# Patient Record
Sex: Male | Born: 1935 | Race: White | Hispanic: No | Marital: Married | Smoking: Never smoker
Health system: Southern US, Community
[De-identification: ages and names within clinical notes are randomized; demographics above are authoritative.]

## PROBLEM LIST (undated history)

## (undated) DIAGNOSIS — G309 Alzheimer's disease, unspecified: Principal | ICD-10-CM

## (undated) DIAGNOSIS — G610 Guillain-Barre syndrome: Secondary | ICD-10-CM

## (undated) DIAGNOSIS — E119 Type 2 diabetes mellitus without complications: Secondary | ICD-10-CM

## (undated) DIAGNOSIS — F0280 Dementia in other diseases classified elsewhere without behavioral disturbance: Principal | ICD-10-CM

## (undated) DIAGNOSIS — F0150 Vascular dementia without behavioral disturbance: Secondary | ICD-10-CM

## (undated) DIAGNOSIS — M47812 Spondylosis without myelopathy or radiculopathy, cervical region: Secondary | ICD-10-CM

## (undated) DIAGNOSIS — I119 Hypertensive heart disease without heart failure: Secondary | ICD-10-CM

## (undated) DIAGNOSIS — H409 Unspecified glaucoma: Secondary | ICD-10-CM

## (undated) DIAGNOSIS — I10 Essential (primary) hypertension: Secondary | ICD-10-CM

## (undated) DIAGNOSIS — Z8546 Personal history of malignant neoplasm of prostate: Secondary | ICD-10-CM

## (undated) HISTORY — DX: Spondylosis without myelopathy or radiculopathy, cervical region: M47.812

## (undated) HISTORY — DX: Vascular dementia, unspecified severity, without behavioral disturbance, psychotic disturbance, mood disturbance, and anxiety: F01.50

## (undated) HISTORY — DX: Unspecified glaucoma: H40.9

## (undated) HISTORY — DX: Essential (primary) hypertension: I10

## (undated) HISTORY — DX: Personal history of malignant neoplasm of prostate: Z85.46

## (undated) HISTORY — PX: INGUINAL HERNIA REPAIR: SUR1180

## (undated) HISTORY — DX: Hypertensive heart disease without heart failure: I11.9

## (undated) HISTORY — DX: Alzheimer's disease, unspecified: G30.9

## (undated) HISTORY — DX: Type 2 diabetes mellitus without complications: E11.9

## (undated) HISTORY — DX: Dementia in other diseases classified elsewhere without behavioral disturbance: F02.80

## (undated) HISTORY — DX: Guillain-Barre syndrome: G61.0

---

## 1958-04-22 HISTORY — PX: CHOLECYSTECTOMY: SHX55

## 1989-04-22 DIAGNOSIS — Z8546 Personal history of malignant neoplasm of prostate: Secondary | ICD-10-CM

## 1989-04-22 DIAGNOSIS — G610 Guillain-Barre syndrome: Secondary | ICD-10-CM

## 1989-04-22 HISTORY — DX: Personal history of malignant neoplasm of prostate: Z85.46

## 1989-04-22 HISTORY — DX: Guillain-Barre syndrome: G61.0

## 1997-10-17 ENCOUNTER — Ambulatory Visit: Admission: RE | Admit: 1997-10-17 | Discharge: 1997-10-17 | Payer: Self-pay | Admitting: Radiation Oncology

## 1999-04-26 ENCOUNTER — Encounter: Admission: RE | Admit: 1999-04-26 | Discharge: 1999-04-26 | Payer: Self-pay | Admitting: Neurosurgery

## 1999-04-26 ENCOUNTER — Encounter: Payer: Self-pay | Admitting: Neurosurgery

## 1999-11-26 ENCOUNTER — Encounter: Admission: RE | Admit: 1999-11-26 | Discharge: 2000-02-24 | Payer: Self-pay | Admitting: Radiation Oncology

## 2000-12-02 ENCOUNTER — Encounter: Admission: RE | Admit: 2000-12-02 | Discharge: 2001-03-02 | Payer: Self-pay | Admitting: Radiation Oncology

## 2002-02-08 ENCOUNTER — Ambulatory Visit (HOSPITAL_COMMUNITY): Admission: RE | Admit: 2002-02-08 | Discharge: 2002-02-08 | Payer: Self-pay | Admitting: Radiation Oncology

## 2003-02-14 ENCOUNTER — Ambulatory Visit: Admission: RE | Admit: 2003-02-14 | Discharge: 2003-02-14 | Payer: Self-pay | Admitting: Radiation Oncology

## 2003-02-28 ENCOUNTER — Ambulatory Visit: Admission: RE | Admit: 2003-02-28 | Discharge: 2003-02-28 | Payer: Self-pay | Admitting: Radiation Oncology

## 2004-02-21 ENCOUNTER — Ambulatory Visit: Payer: Self-pay | Admitting: Gastroenterology

## 2004-03-05 ENCOUNTER — Ambulatory Visit: Admission: RE | Admit: 2004-03-05 | Discharge: 2004-03-05 | Payer: Self-pay | Admitting: Radiation Oncology

## 2004-03-12 ENCOUNTER — Ambulatory Visit: Admission: RE | Admit: 2004-03-12 | Discharge: 2004-03-12 | Payer: Self-pay | Admitting: Radiation Oncology

## 2004-03-27 ENCOUNTER — Ambulatory Visit: Admission: RE | Admit: 2004-03-27 | Discharge: 2004-03-27 | Payer: Self-pay | Admitting: Radiation Oncology

## 2005-06-10 ENCOUNTER — Emergency Department: Payer: Self-pay | Admitting: Emergency Medicine

## 2005-06-10 ENCOUNTER — Other Ambulatory Visit: Payer: Self-pay

## 2006-10-22 ENCOUNTER — Ambulatory Visit: Payer: Self-pay | Admitting: Unknown Physician Specialty

## 2010-06-01 ENCOUNTER — Inpatient Hospital Stay: Payer: Self-pay | Admitting: Internal Medicine

## 2010-06-06 ENCOUNTER — Encounter: Payer: Self-pay | Admitting: Internal Medicine

## 2010-06-21 ENCOUNTER — Encounter: Payer: Self-pay | Admitting: Internal Medicine

## 2011-06-24 ENCOUNTER — Emergency Department: Payer: Self-pay | Admitting: Emergency Medicine

## 2013-06-22 ENCOUNTER — Emergency Department: Payer: Self-pay | Admitting: Emergency Medicine

## 2013-06-22 LAB — COMPREHENSIVE METABOLIC PANEL
ALK PHOS: 105 U/L
ALT: 20 U/L (ref 12–78)
Albumin: 3.5 g/dL (ref 3.4–5.0)
Anion Gap: 6 — ABNORMAL LOW (ref 7–16)
BILIRUBIN TOTAL: 0.5 mg/dL (ref 0.2–1.0)
BUN: 20 mg/dL — ABNORMAL HIGH (ref 7–18)
Calcium, Total: 8.7 mg/dL (ref 8.5–10.1)
Chloride: 97 mmol/L — ABNORMAL LOW (ref 98–107)
Co2: 29 mmol/L (ref 21–32)
Creatinine: 1.08 mg/dL (ref 0.60–1.30)
EGFR (African American): >60
EGFR (Non-African Amer.): 59 — ABNORMAL LOW
Glucose: 180 mg/dL — ABNORMAL HIGH (ref 65–99)
Osmolality: 272 (ref 275–301)
POTASSIUM: 3.7 mmol/L (ref 3.5–5.1)
SGOT(AST): 21 U/L (ref 15–37)
SODIUM: 132 mmol/L — AB (ref 136–145)
Total Protein: 7.5 g/dL (ref 6.4–8.2)

## 2013-06-22 LAB — URINALYSIS, COMPLETE
BILIRUBIN, UR: NEGATIVE
Bacteria: NONE SEEN
Blood: NEGATIVE
Glucose,UR: 50 mg/dL (ref 0–75)
Hyaline Cast: 6
KETONE: NEGATIVE
LEUKOCYTE ESTERASE: NEGATIVE
Nitrite: NEGATIVE
PROTEIN: NEGATIVE
Ph: 6 (ref 4.5–8.0)
RBC,UR: 2 /HPF (ref 0–5)
Specific Gravity: 1.016 (ref 1.003–1.030)
Squamous Epithelial: <1
WBC UR: 1 /HPF (ref 0–5)

## 2013-06-22 LAB — CBC
HCT: 40.2 % (ref 40.0–52.0)
HGB: 13.7 g/dL (ref 13.0–18.0)
MCH: 31.5 pg (ref 26.0–34.0)
MCHC: 34.1 g/dL (ref 32.0–36.0)
MCV: 92 fL (ref 80–100)
PLATELETS: 245 10*3/uL (ref 150–440)
RBC: 4.35 10*6/uL — ABNORMAL LOW (ref 4.40–5.90)
RDW: 13.6 % (ref 11.5–14.5)
WBC: 10.7 10*3/uL — ABNORMAL HIGH (ref 3.8–10.6)

## 2013-06-22 LAB — TROPONIN I: Troponin-I: <0.02 ng/mL

## 2013-07-07 ENCOUNTER — Telehealth: Payer: Self-pay | Admitting: Internal Medicine

## 2013-07-07 NOTE — Telephone Encounter (Signed)
Find out if this is the same patient they called about earlier who lives out by [place] I was going to add him on my home visit list but never heard back from anyone

## 2013-07-07 NOTE — Telephone Encounter (Signed)
Molli KnockOkay You can add this Friday if they can make it

## 2013-07-07 NOTE — Telephone Encounter (Signed)
Patient is seeing Mimi McLaughlin,PA at [facility].  He did have a Doctor until 2 years ago, when his doctor retired.  Patient is being seen by [facility] after a hospital er visit.  Patient was unable to stand or walk.  ER recommended Physical Therapy and sent out [facility]. Mimi Merlinda FrederickMcLaughlin saw patient after his er visit and told him it could be shingles and gave them medication. The social worker with [facility] told them to call and try and get in with Dr.Letvak.  I scheduled patient for a new patient visit with Dr.Letvak on 11/23/13.  Patient's wife asked if you could see him sooner. Please advise.

## 2013-07-07 NOTE — Telephone Encounter (Signed)
I spoke to patient's wife.  This is a different patient.  He's able to come to the office.

## 2013-07-09 ENCOUNTER — Encounter: Payer: Self-pay | Admitting: Internal Medicine

## 2013-07-09 ENCOUNTER — Ambulatory Visit (INDEPENDENT_AMBULATORY_CARE_PROVIDER_SITE_OTHER): Payer: Medicare Other | Admitting: Internal Medicine

## 2013-07-09 VITALS — BP 110/60 | HR 86 | Temp 97.4°F | Ht 63.0 in | Wt 145.0 lb

## 2013-07-09 DIAGNOSIS — G309 Alzheimer's disease, unspecified: Principal | ICD-10-CM

## 2013-07-09 DIAGNOSIS — F0150 Vascular dementia without behavioral disturbance: Secondary | ICD-10-CM

## 2013-07-09 DIAGNOSIS — R519 Headache, unspecified: Secondary | ICD-10-CM | POA: Insufficient documentation

## 2013-07-09 DIAGNOSIS — R51 Headache: Secondary | ICD-10-CM

## 2013-07-09 DIAGNOSIS — I10 Essential (primary) hypertension: Secondary | ICD-10-CM

## 2013-07-09 DIAGNOSIS — G610 Guillain-Barre syndrome: Secondary | ICD-10-CM

## 2013-07-09 DIAGNOSIS — F0280 Dementia in other diseases classified elsewhere without behavioral disturbance: Secondary | ICD-10-CM

## 2013-07-09 DIAGNOSIS — E119 Type 2 diabetes mellitus without complications: Secondary | ICD-10-CM

## 2013-07-09 DIAGNOSIS — M47812 Spondylosis without myelopathy or radiculopathy, cervical region: Secondary | ICD-10-CM | POA: Insufficient documentation

## 2013-07-09 NOTE — Assessment & Plan Note (Signed)
Hasn't been checking sugars Control has been okay Will get the records

## 2013-07-09 NOTE — Assessment & Plan Note (Signed)
BP Readings from Last 3 Encounters:  07/09/13 110/60   Will stop the amlodipine

## 2013-07-09 NOTE — Progress Notes (Signed)
Pre visit review using our clinic review tool, if applicable. No additional management support is needed unless otherwise documented below in the visit note. 

## 2013-07-09 NOTE — Assessment & Plan Note (Signed)
Still disabled from this to some degree

## 2013-07-09 NOTE — Progress Notes (Signed)
Subjective:    Patient ID: Shawn Castaneda, male    DOB: March 20, 1918, 80 y.o.   MRN: [id number]  HPI Here to establish With son [name] and wife Was seeing Dr Lin GivensJeffries--- she left and have only seen PAs since then Want to see a doctor  Had ER visit about 2 weeks ago Total weakness--[facility] ?shadow on CXR and given 750mg  levaquin (though no cough or fever) Given prednisone "in case shingles"---- 6 days. This was a "nightmare"---increased agitation Has had headache since then---above right eye Had exam with Dr Oren BracketSydnor. No eye findings but ?TA Chewing makes it worse Sensitive to touch  Diagnosed with dementia close to 20 years ago Got lost driving, etc (8-9 years ago) Had CT of head--?small strokes Now needing care for ADLs---incontinent of B/B and needs help with bathing and dressing Usually feeds himself aricept for 8-9 years. Namenda at first sedated him. Now back on it 3 years or so (after falling and needing hospitalization for hypoglycemic reaction) Wife does the care with help from [employer] 4 mornings a week Son takes him to [employer] every day for lunch Wife plans trip to [place] next month for respite  NIDDM goes back 40-50 years Generally has had good control No retinopathy (amblyopia in right eye). Glaucoma also No apparent neuropathy or nephropathy  HTN as well Does well with the BP med No coronary artery disease  No current outpatient prescriptions on file prior to visit.   No current facility-administered medications on file prior to visit.    Allergies  Allergen Reactions  . Influenza Vaccines     Guillain Barre syndrome     Past Medical History  Diagnosis Date  . Type II or unspecified type diabetes mellitus without mention of complication, not stated as uncontrolled   . Hypertension   . Mixed Alzheimer's and vascular dementia   . Guillain-Barre disease 1991    paralyzed from neck down-- never fully recovered  . Glaucoma   . H/O  prostate cancer 1991    RT only  . Cervical spine arthritis     Past Surgical History  Procedure Laterality Date  . Cholecystectomy  1960  . Inguinal hernia repair      Family History  Problem Relation Age of Onset  . Stroke Sister     History   Social History  . Marital Status: Married    Spouse Name: N/A    Number of Children: 4  . Years of Education: N/A   Occupational History  . 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 Smoking status: Never Smoker   . Smokeless tobacco: Never Used  . Alcohol Use: No  . Drug Use: No  . Sexual Activity: Not on file   Other Topics Concern  . Not on file   Social History Narrative   Divorced 1974---remarried 1977   4 children      Has living will   Wife has health care POA.   Has DNR--- reviewed and redone 07/09/13   No tube feeds   Review of Systems  Constitutional: Negative for unexpected weight change.  HENT: Positive for hearing loss.        Top dentures  Respiratory: Negative for cough and shortness of breath.   Cardiovascular: Positive for chest pain and leg swelling.       Wears compression stockings since the [place]  Gastrointestinal:  Negative for diarrhea and constipation.       No apparent diarrhea  Genitourinary: Negative for hematuria and difficulty urinating.       Incontinent  Musculoskeletal: Positive for arthralgias and back pain. Negative for joint swelling.  Neurological: Positive for weakness and headaches.  Psychiatric/Behavioral: Positive for agitation. Negative for sleep disturbance and dysphoric mood.       Occasional agitation but no persistent mood problems       Objective:   Physical Exam  Constitutional: He appears well-nourished. No distress.  HENT:  Mouth/Throat: Oropharynx is clear and moist. No oropharyngeal exudate.  No temporal bruit  Cardiovascular: Normal rate, regular rhythm and normal heart sounds.  Exam  reveals no gallop.   No murmur heard. Faint but palpable pedal pulses  Pulmonary/Chest: Effort normal and breath sounds normal. No respiratory distress. He has no wheezes. He has no rales.  Abdominal: Soft. There is no tenderness.  Musculoskeletal: He exhibits no edema.  Support hose on  Neurological:  Passive but gives some simple answers   Skin: No rash noted.  No foot lesions  Psychiatric:  Calm Neutral mood and appropriate affect          Assessment & Plan:

## 2013-07-09 NOTE — Assessment & Plan Note (Signed)
Only started after the ER visit ?hit head on gurney Doubt temporal arteritis especially since wife says he had normal sed rate at [facility] last week I will get the records Discussed heat, etc

## 2013-07-09 NOTE — Patient Instructions (Signed)
Please get the records for the past 2 years (including labs) from [facility]

## 2013-07-09 NOTE — Assessment & Plan Note (Signed)
Moderate with extensive care needs Clear lack of insight and progression of Alzheimers but also vascular Seems to have some benefit from the meds

## 2013-07-12 ENCOUNTER — Telehealth: Payer: Self-pay | Admitting: Internal Medicine

## 2013-07-12 NOTE — Telephone Encounter (Signed)
Relevant patient education assigned to patient using Emmi. ° °

## 2013-07-20 ENCOUNTER — Telehealth: Payer: Self-pay

## 2013-07-20 NOTE — Telephone Encounter (Signed)
Relevant patient education assigned to patient using Emmi. ° °

## 2013-07-27 ENCOUNTER — Encounter: Payer: Self-pay | Admitting: Internal Medicine

## 2013-07-27 DIAGNOSIS — I119 Hypertensive heart disease without heart failure: Secondary | ICD-10-CM | POA: Insufficient documentation

## 2013-08-24 ENCOUNTER — Encounter: Payer: Self-pay | Admitting: Internal Medicine

## 2013-08-24 ENCOUNTER — Telehealth: Payer: Self-pay

## 2013-08-24 NOTE — Telephone Encounter (Signed)
pts wife who has POA needs FL2 form filled out; Mrs Valerie Saltsrtley is beginning to look at nursing facilities for pt. Pt is much more confused. Care giver and pts wife is not feeling comfortable taking care of pt; Mrs Valerie Saltsrtley said it is not safe for pt because pt his mobility issues going to bathroom. Mrs Esquivel request cb; she said hopes FL2 can be filled out from recent visit.

## 2013-08-24 NOTE — Telephone Encounter (Signed)
Wife was under the impression that he would need skilled nursing care, went to [facility] and she was hoping for [facility], but waiting for a call back for a visit. She also like [facility]. But she thinks he's too much for memory care because he can do nothing for himself.

## 2013-08-24 NOTE — Telephone Encounter (Signed)
I do have the information I need so I can do the FL-2 There is a $20 charge for the work Make sure they are looking for a Memory Care unit as I have to specify that The FL-2 is only good for 30 days also--- so make sure they will be looking very soon

## 2013-08-26 ENCOUNTER — Encounter: Payer: Self-pay | Admitting: Internal Medicine

## 2013-08-26 DIAGNOSIS — Z0279 Encounter for issue of other medical certificate: Secondary | ICD-10-CM

## 2013-08-26 MED ORDER — MEMANTINE HCL 10 MG PO TABS: 5.0000 mg | ORAL_TABLET | Freq: Two times a day (BID) | ORAL | Status: AC

## 2013-08-26 MED ORDER — METFORMIN HCL 500 MG PO TABS: 500.0000 mg | ORAL_TABLET | Freq: Two times a day (BID) | ORAL | Status: AC

## 2013-08-26 MED ORDER — GLIMEPIRIDE 4 MG PO TABS: 4.0000 mg | ORAL_TABLET | Freq: Every day | ORAL | Status: AC

## 2013-08-26 MED ORDER — LOSARTAN POTASSIUM-HCTZ 50-12.5 MG PO TABS: 1.0000 | ORAL_TABLET | Freq: Every day | ORAL | Status: AC

## 2013-08-26 MED ORDER — DONEPEZIL HCL 5 MG PO TABS: 5.0000 mg | ORAL_TABLET | Freq: Every day | ORAL | Status: AC

## 2013-08-26 NOTE — Telephone Encounter (Signed)
Spoke with patient's wife and advised results rx sent to pharmacy by e-script FL-2 gave to carrie to charge and advised wife it will be up front for pick-up

## 2013-08-26 NOTE — Telephone Encounter (Signed)
Form done $20 charge  Please check with wife She states she [facility] and they had supposedly contacted us alreadty

## 2013-08-30 ENCOUNTER — Telehealth: Payer: Self-pay | Admitting: Internal Medicine

## 2013-08-30 NOTE — Telephone Encounter (Signed)
Will assess at visit Vague symptoms but could check urine at visit

## 2013-08-30 NOTE — Telephone Encounter (Signed)
Patient Information:  Caller Name: Carney BernJean  Phone: [phone]  Patient: Shawn Castaneda, Shawn Castaneda  Gender: Male  DOB: October 24, 1917  Age: 80 Years  PCP: Tillman AbideLetvak , Richard [facility](Family Practice)  Office Follow Up:  Does the office need to follow up with this patient?: No  Instructions For The Office: N/A   Symptoms  Reason For Call & Symptoms: Worried about UTI since so confused and so weak over weekend - had lunch on Sat 5/9 then slept from 8 pm to 8 pm on Sunday 5/10.  Has felt like going down steep slope for a week, has had problems standing up and needing help except for once or twice on walker today 5/11 with help.  Urinary accidents and one diarrhea stool accident this am between 8:30am-10:30am.  Not drinking well, Has had difficulty with right foot for a week.   downhill steep slope for a week    could not stand up. seems to have moments of lucidity  Reviewed Health History In EMR: Yes  Reviewed Medications In EMR: Yes  Reviewed Allergies In EMR: Yes  Reviewed Surgeries / Procedures: Yes  Date of Onset of Symptoms: 08/28/2013  Guideline(s) Used:  Diarrhea  Weakness (Generalized) and Fatigue  Confusion - Delirium  Disposition Per Guideline:   See Today or Tomorrow in Office  Reason For Disposition Reached:   Longstanding confusion (e.g., dementia, stroke) and worsening  Advice Given:  N/A  Patient Will Follow Care Advice:  YES  Appointment Scheduled:  08/31/2013 11:00:00 Appointment Scheduled Provider:  Tillman AbideLetvak , Richard (Family Practice)

## 2013-08-31 ENCOUNTER — Encounter: Payer: Self-pay | Admitting: Internal Medicine

## 2013-08-31 ENCOUNTER — Ambulatory Visit (INDEPENDENT_AMBULATORY_CARE_PROVIDER_SITE_OTHER): Payer: Medicare Other | Admitting: Internal Medicine

## 2013-08-31 VITALS — BP 130/70 | HR 94 | Temp 97.7°F | Wt 143.0 lb

## 2013-08-31 DIAGNOSIS — G309 Alzheimer's disease, unspecified: Principal | ICD-10-CM

## 2013-08-31 DIAGNOSIS — Z111 Encounter for screening for respiratory tuberculosis: Secondary | ICD-10-CM

## 2013-08-31 DIAGNOSIS — F0280 Dementia in other diseases classified elsewhere without behavioral disturbance: Secondary | ICD-10-CM

## 2013-08-31 DIAGNOSIS — E119 Type 2 diabetes mellitus without complications: Secondary | ICD-10-CM

## 2013-08-31 DIAGNOSIS — F0150 Vascular dementia without behavioral disturbance: Secondary | ICD-10-CM

## 2013-08-31 DIAGNOSIS — Z201 Contact with and (suspected) exposure to tuberculosis: Secondary | ICD-10-CM

## 2013-08-31 DIAGNOSIS — I119 Hypertensive heart disease without heart failure: Secondary | ICD-10-CM

## 2013-08-31 LAB — COMPREHENSIVE METABOLIC PANEL
ALBUMIN: 3.9 g/dL (ref 3.5–5.2)
ALT: 16 U/L (ref 0–53)
AST: 22 U/L (ref 0–37)
Alkaline Phosphatase: 68 U/L (ref 39–117)
BUN: 21 mg/dL (ref 6–23)
CHLORIDE: 95 meq/L — AB (ref 96–112)
CO2: 28 mEq/L (ref 19–32)
Calcium: 9.2 mg/dL (ref 8.4–10.5)
Creatinine, Ser: 1 mg/dL (ref 0.4–1.5)
GFR: 74 mL/min (ref >60.00)
GLUCOSE: 250 mg/dL — AB (ref 70–99)
POTASSIUM: 3.6 meq/L (ref 3.5–5.1)
SODIUM: 133 meq/L — AB (ref 135–145)
TOTAL PROTEIN: 6.7 g/dL (ref 6.0–8.3)
Total Bilirubin: 0.9 mg/dL (ref 0.2–1.2)

## 2013-08-31 LAB — LIPID PANEL
CHOL/HDL RATIO: 4
Cholesterol: 170 mg/dL (ref 0–200)
HDL: 44.2 mg/dL (ref >39.00)
LDL Cholesterol: 92 mg/dL (ref 0–99)
Triglycerides: 168 mg/dL — ABNORMAL HIGH (ref 0.0–149.0)
VLDL: 33.6 mg/dL (ref 0.0–40.0)

## 2013-08-31 LAB — CBC WITH DIFFERENTIAL/PLATELET
BASOS ABS: 0.1 10*3/uL (ref 0.0–0.1)
BASOS PCT: 0.4 % (ref 0.0–3.0)
EOS ABS: 0.2 10*3/uL (ref 0.0–0.7)
Eosinophils Relative: 1.3 % (ref 0.0–5.0)
HCT: 40.9 % (ref 39.0–52.0)
Hemoglobin: 13.8 g/dL (ref 13.0–17.0)
LYMPHS PCT: 15.5 % (ref 12.0–46.0)
Lymphs Abs: 1.8 10*3/uL (ref 0.7–4.0)
MCHC: 33.8 g/dL (ref 30.0–36.0)
MCV: 92.8 fl (ref 78.0–100.0)
MONO ABS: 0.7 10*3/uL (ref 0.1–1.0)
Monocytes Relative: 6 % (ref 3.0–12.0)
NEUTROS PCT: 76.8 % (ref 43.0–77.0)
Neutro Abs: 9.1 10*3/uL — ABNORMAL HIGH (ref 1.4–7.7)
PLATELETS: 245 10*3/uL (ref 150.0–400.0)
RBC: 4.41 Mil/uL (ref 4.22–5.81)
RDW: 13.7 % (ref 11.5–15.5)
WBC: 11.8 10*3/uL — AB (ref 4.0–10.5)

## 2013-08-31 LAB — T4, FREE: FREE T4: 1.04 ng/dL (ref 0.60–1.60)

## 2013-08-31 LAB — HEMOGLOBIN A1C: HEMOGLOBIN A1C: 7.8 % — AB (ref 4.6–6.5)

## 2013-08-31 LAB — TSH: TSH: 1.1 u[IU]/mL (ref 0.35–4.50)

## 2013-08-31 NOTE — Progress Notes (Signed)
Subjective:    Patient ID: Shawn Castaneda, male    DOB: 07-May-1917, 80 y.o.   MRN: [id number]  HPI Here with wife and son-- [name]  Things are worsening Much more confused--this is handleable Now can't stand or support his weight at times Increased sleeping-- slept 24 hours straight this weekend. Would awake briefly, say something "off the wall" then go back to sleep But then able to walk last night with walker Yesterday, couldn't walk again  Needs assist with all ADLs May feed himself after his wife starts for him Speech garbled--hard to understand at times  Still has some headache No breathing problems  Not checking sugars at all No apparent hypoglycemia  Current Outpatient Prescriptions on File Prior to Visit  Medication Sig Dispense Refill  . donepezil (ARICEPT) 5 MG tablet Take 1 tablet (5 mg total) by mouth at bedtime.  90 tablet  3  . glimepiride (AMARYL) 4 MG tablet Take 1 tablet (4 mg total) by mouth daily with breakfast.  90 tablet  3  . losartan-hydrochlorothiazide (HYZAAR) 50-12.5 MG per tablet Take 1 tablet by mouth daily.  90 tablet  3  . memantine (NAMENDA) 10 MG tablet Take 0.5 tablets (5 mg total) by mouth 2 (two) times daily.  90 tablet  3  . metFORMIN (GLUCOPHAGE) 500 MG tablet Take 1 tablet (500 mg total) by mouth 2 (two) times daily with a meal.  180 tablet  3   No current facility-administered medications on file prior to visit.    Allergies  Allergen Reactions  . Influenza Vaccines     Guillain Barre syndrome     Past Medical History  Diagnosis Date  . Type II or unspecified type diabetes mellitus without mention of complication, not stated as uncontrolled   . Hypertension   . Mixed Alzheimer's and vascular dementia   . Guillain-Barre disease 1991    paralyzed from neck down-- never fully recovered  . Glaucoma   . H/O prostate cancer 1991    RT only  . Cervical spine arthritis   . Hypertensive heart disease     Past Surgical History    Procedure Laterality Date  . Cholecystectomy  1960  . Inguinal hernia repair Bilateral     Family History  Problem Relation Age of Onset  . Stroke Sister     History   Social History  . Marital Status: Married    Spouse Name: N/A    Number of Children: 4  . Years of Education: N/A   Occupational History  . 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 Smoking status: Never Smoker   . Smokeless tobacco: Never Used  . Alcohol Use: No  . Drug Use: No  . Sexual Activity: Not on file   Other Topics Concern  . Not on file   Social History Narrative   Divorced 1974---remarried 1977   4 children      Has living will   Wife has health care POA.   Has DNR--- reviewed and redone 07/09/13   No tube feeds   Review of Systems Appetite is okay Weight stable Voids okay--no signs of retention. Incontinent almost all the time     Objective:   Physical Exam  Constitutional: He appears well-developed and well-nourished. No distress.  HENT:  Very HOH  Neck: Normal range of motion.  Cardiovascular: Normal rate, regular rhythm and normal heart sounds.  Exam reveals no gallop.   No murmur heard. Pulmonary/Chest: Effort normal and breath sounds normal. No respiratory distress. He has no wheezes. He has no rales.  Abdominal: Soft. There is no tenderness.  Musculoskeletal: He exhibits no edema and no tenderness.  Lymphadenopathy:    He has no cervical adenopathy.  Neurological:  Passive but does answer briefly          Assessment & Plan:

## 2013-08-31 NOTE — Assessment & Plan Note (Signed)
Will recheck labs Doesn't seem to have hypoglycemia as part of his decline

## 2013-08-31 NOTE — Progress Notes (Signed)
Pre visit review using our clinic review tool, if applicable. No additional management support is needed unless otherwise documented below in the visit note. 

## 2013-08-31 NOTE — Assessment & Plan Note (Signed)
No apparent symptoms On ARB

## 2013-08-31 NOTE — Addendum Note (Signed)
Addended by: Sueanne MargaritaSMITH, Miyah Hampshire L on: 08/31/2013 12:03 PM   Modules accepted: Orders

## 2013-08-31 NOTE — Assessment & Plan Note (Signed)
Has had functional decline-- not getting up regularly to stand and more extended sleeping spells Probably needs skilled care Discussed options Will just recheck labs

## 2013-09-02 ENCOUNTER — Encounter: Payer: Self-pay | Admitting: Internal Medicine

## 2013-09-02 LAB — TB SKIN TEST: TB Skin Test: NEGATIVE

## 2013-09-07 ENCOUNTER — Telehealth: Payer: Self-pay

## 2013-09-07 DIAGNOSIS — G309 Alzheimer's disease, unspecified: Secondary | ICD-10-CM

## 2013-09-07 DIAGNOSIS — G610 Guillain-Barre syndrome: Secondary | ICD-10-CM

## 2013-09-07 DIAGNOSIS — F0150 Vascular dementia without behavioral disturbance: Secondary | ICD-10-CM

## 2013-09-07 DIAGNOSIS — F0280 Dementia in other diseases classified elsewhere without behavioral disturbance: Secondary | ICD-10-CM

## 2013-09-07 DIAGNOSIS — E119 Type 2 diabetes mellitus without complications: Secondary | ICD-10-CM

## 2013-09-07 DIAGNOSIS — I119 Hypertensive heart disease without heart failure: Secondary | ICD-10-CM

## 2013-09-07 NOTE — Telephone Encounter (Signed)
Shawn Castaneda [facility]ON at [facility] left v/m;Pt is at [facility] for [facility] term rehab; has pt had pneumovax and if so what date did pt have pneumovax. I did not see pneumovax listed.Please advise.

## 2013-09-08 NOTE — Telephone Encounter (Signed)
I took care of this.

## 2013-09-28 DIAGNOSIS — E119 Type 2 diabetes mellitus without complications: Secondary | ICD-10-CM

## 2013-09-28 DIAGNOSIS — I119 Hypertensive heart disease without heart failure: Secondary | ICD-10-CM

## 2013-09-28 DIAGNOSIS — F0280 Dementia in other diseases classified elsewhere without behavioral disturbance: Secondary | ICD-10-CM

## 2013-09-28 DIAGNOSIS — G309 Alzheimer's disease, unspecified: Secondary | ICD-10-CM

## 2013-09-28 DIAGNOSIS — G610 Guillain-Barre syndrome: Secondary | ICD-10-CM

## 2013-10-05 DIAGNOSIS — L723 Sebaceous cyst: Secondary | ICD-10-CM

## 2013-10-13 ENCOUNTER — Ambulatory Visit: Payer: Medicare Other | Admitting: Internal Medicine

## 2013-10-27 DIAGNOSIS — G610 Guillain-Barre syndrome: Secondary | ICD-10-CM

## 2013-10-27 DIAGNOSIS — E1165 Type 2 diabetes mellitus with hyperglycemia: Secondary | ICD-10-CM

## 2013-10-27 DIAGNOSIS — G309 Alzheimer's disease, unspecified: Secondary | ICD-10-CM

## 2013-10-27 DIAGNOSIS — F0280 Dementia in other diseases classified elsewhere without behavioral disturbance: Secondary | ICD-10-CM

## 2013-10-27 DIAGNOSIS — IMO0001 Reserved for inherently not codable concepts without codable children: Secondary | ICD-10-CM

## 2013-10-27 DIAGNOSIS — I10 Essential (primary) hypertension: Secondary | ICD-10-CM

## 2013-11-23 ENCOUNTER — Ambulatory Visit: Payer: Self-pay | Admitting: Internal Medicine

## 2013-12-07 DIAGNOSIS — I119 Hypertensive heart disease without heart failure: Secondary | ICD-10-CM

## 2013-12-07 DIAGNOSIS — F0150 Vascular dementia without behavioral disturbance: Secondary | ICD-10-CM

## 2013-12-07 DIAGNOSIS — F0280 Dementia in other diseases classified elsewhere without behavioral disturbance: Secondary | ICD-10-CM

## 2013-12-07 DIAGNOSIS — G610 Guillain-Barre syndrome: Secondary | ICD-10-CM

## 2013-12-07 DIAGNOSIS — G309 Alzheimer's disease, unspecified: Secondary | ICD-10-CM

## 2013-12-07 DIAGNOSIS — E119 Type 2 diabetes mellitus without complications: Secondary | ICD-10-CM

## 2014-02-02 DIAGNOSIS — F0390 Unspecified dementia without behavioral disturbance: Secondary | ICD-10-CM

## 2014-02-02 DIAGNOSIS — E1165 Type 2 diabetes mellitus with hyperglycemia: Secondary | ICD-10-CM

## 2014-02-02 DIAGNOSIS — I10 Essential (primary) hypertension: Secondary | ICD-10-CM

## 2014-02-02 DIAGNOSIS — G610 Guillain-Barre syndrome: Secondary | ICD-10-CM

## 2014-04-05 DIAGNOSIS — G610 Guillain-Barre syndrome: Secondary | ICD-10-CM

## 2014-04-05 DIAGNOSIS — I110 Hypertensive heart disease with heart failure: Secondary | ICD-10-CM

## 2014-04-05 DIAGNOSIS — F0390 Unspecified dementia without behavioral disturbance: Secondary | ICD-10-CM

## 2014-05-30 DIAGNOSIS — R238 Other skin changes: Secondary | ICD-10-CM

## 2014-07-04 DIAGNOSIS — R21 Rash and other nonspecific skin eruption: Secondary | ICD-10-CM | POA: Diagnosis not present

## 2014-08-17 DIAGNOSIS — F0150 Vascular dementia without behavioral disturbance: Secondary | ICD-10-CM

## 2014-08-17 DIAGNOSIS — E1140 Type 2 diabetes mellitus with diabetic neuropathy, unspecified: Secondary | ICD-10-CM | POA: Diagnosis not present

## 2014-08-17 DIAGNOSIS — G309 Alzheimer's disease, unspecified: Secondary | ICD-10-CM | POA: Diagnosis not present

## 2014-08-17 DIAGNOSIS — G610 Guillain-Barre syndrome: Secondary | ICD-10-CM | POA: Diagnosis not present

## 2014-08-17 DIAGNOSIS — I10 Essential (primary) hypertension: Secondary | ICD-10-CM | POA: Diagnosis not present

## 2014-10-13 DIAGNOSIS — E119 Type 2 diabetes mellitus without complications: Secondary | ICD-10-CM | POA: Diagnosis not present

## 2014-10-13 DIAGNOSIS — I119 Hypertensive heart disease without heart failure: Secondary | ICD-10-CM | POA: Diagnosis not present

## 2014-10-13 DIAGNOSIS — F0280 Dementia in other diseases classified elsewhere without behavioral disturbance: Secondary | ICD-10-CM | POA: Diagnosis not present

## 2014-10-13 DIAGNOSIS — G308 Other Alzheimer's disease: Secondary | ICD-10-CM | POA: Diagnosis not present

## 2014-10-13 DIAGNOSIS — G610 Guillain-Barre syndrome: Secondary | ICD-10-CM

## 2014-12-07 DIAGNOSIS — E1165 Type 2 diabetes mellitus with hyperglycemia: Secondary | ICD-10-CM | POA: Diagnosis not present

## 2014-12-07 DIAGNOSIS — I10 Essential (primary) hypertension: Secondary | ICD-10-CM | POA: Diagnosis not present

## 2014-12-07 DIAGNOSIS — G610 Guillain-Barre syndrome: Secondary | ICD-10-CM | POA: Diagnosis not present

## 2014-12-07 DIAGNOSIS — G309 Alzheimer's disease, unspecified: Secondary | ICD-10-CM | POA: Diagnosis not present

## 2014-12-07 DIAGNOSIS — F0150 Vascular dementia without behavioral disturbance: Secondary | ICD-10-CM

## 2014-12-30 DIAGNOSIS — L0231 Cutaneous abscess of buttock: Secondary | ICD-10-CM | POA: Diagnosis not present

## 2015-01-31 DIAGNOSIS — I119 Hypertensive heart disease without heart failure: Secondary | ICD-10-CM | POA: Diagnosis not present

## 2015-01-31 DIAGNOSIS — G610 Guillain-Barre syndrome: Secondary | ICD-10-CM | POA: Diagnosis not present

## 2015-01-31 DIAGNOSIS — G308 Other Alzheimer's disease: Secondary | ICD-10-CM | POA: Diagnosis not present

## 2015-01-31 DIAGNOSIS — F0280 Dementia in other diseases classified elsewhere without behavioral disturbance: Secondary | ICD-10-CM

## 2015-01-31 DIAGNOSIS — E119 Type 2 diabetes mellitus without complications: Secondary | ICD-10-CM | POA: Diagnosis not present

## 2015-04-05 DIAGNOSIS — F0150 Vascular dementia without behavioral disturbance: Secondary | ICD-10-CM

## 2015-04-05 DIAGNOSIS — G610 Guillain-Barre syndrome: Secondary | ICD-10-CM | POA: Diagnosis not present

## 2015-04-05 DIAGNOSIS — I110 Hypertensive heart disease with heart failure: Secondary | ICD-10-CM | POA: Diagnosis not present

## 2015-04-05 DIAGNOSIS — G309 Alzheimer's disease, unspecified: Secondary | ICD-10-CM | POA: Diagnosis not present

## 2015-04-05 DIAGNOSIS — E1165 Type 2 diabetes mellitus with hyperglycemia: Secondary | ICD-10-CM | POA: Diagnosis not present

## 2015-05-01 DIAGNOSIS — L0231 Cutaneous abscess of buttock: Secondary | ICD-10-CM | POA: Diagnosis not present

## 2015-05-03 DIAGNOSIS — R1030 Lower abdominal pain, unspecified: Secondary | ICD-10-CM | POA: Diagnosis not present

## 2015-05-03 DIAGNOSIS — K5900 Constipation, unspecified: Secondary | ICD-10-CM | POA: Diagnosis not present

## 2015-06-21 DEATH — deceased

## 2016-02-17 IMAGING — CT CT HEAD WITHOUT CONTRAST
2 series · 15 of 30 positions shown, 19 images · non-contrast
Comparison: 06/01/2010 Head CT.

CLINICAL DATA: [AGE] male with increasing weakness, facial
asymmetry. Initial encounter.

EXAM:
CT HEAD WITHOUT CONTRAST
TECHNIQUE: Contiguous axial images were obtained from the base of the skull
through the vertex without intravenous contrast.

[Series 2: soft tissue · axial · 0.42mm/px · z∈[-202,-77]mm · 13 of 31 slices shown, 17 images (1 of 2)]
[im 3/31  brain]
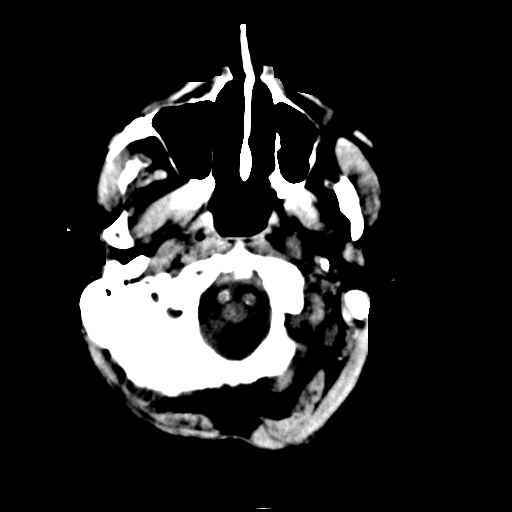
[im 3/31  bone]
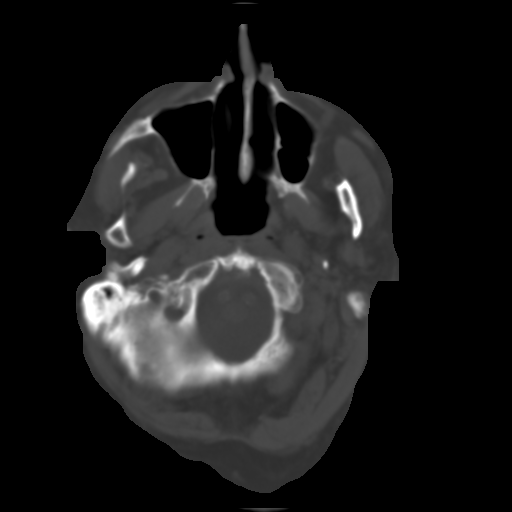
[im 5/31  brain]
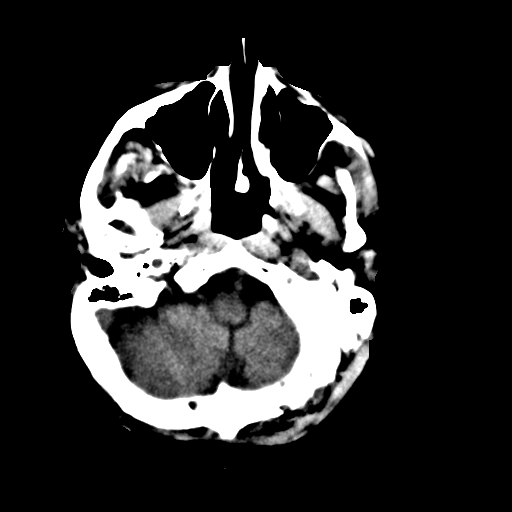
[im 7/31  brain]
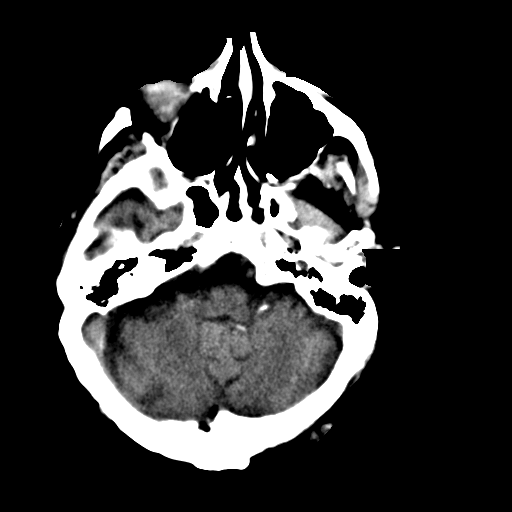
[im 9/31  brain]
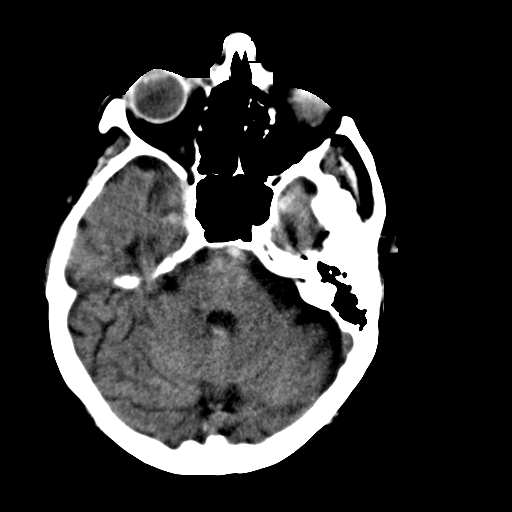
[im 11/31  brain]
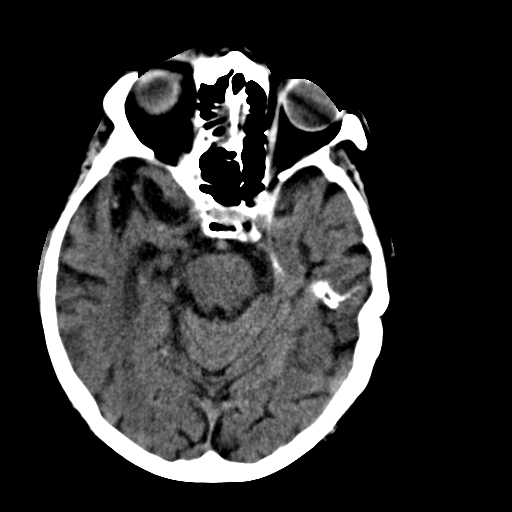
[im 11/31  bone]
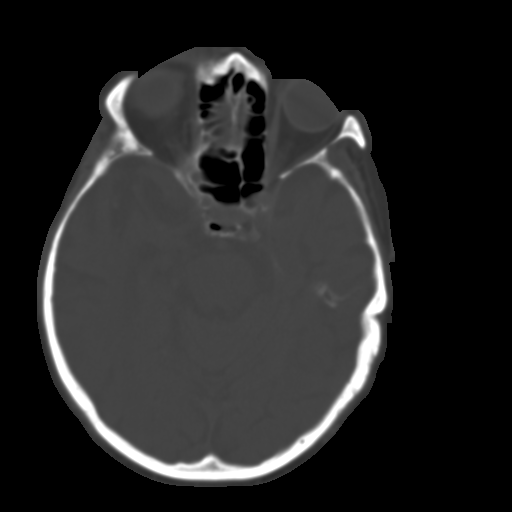
[im 13/31  brain]
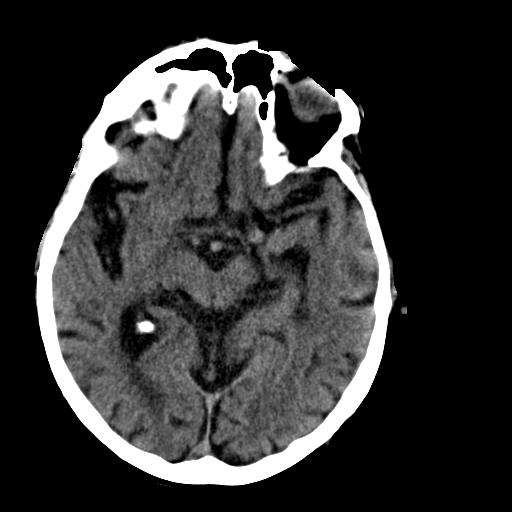
[im 16/31  brain]
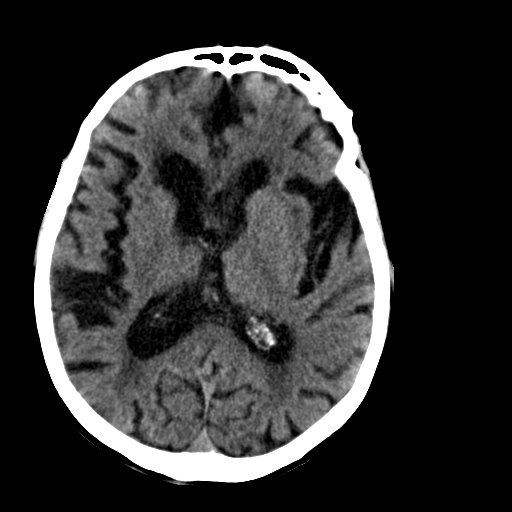
[im 18/31  brain]
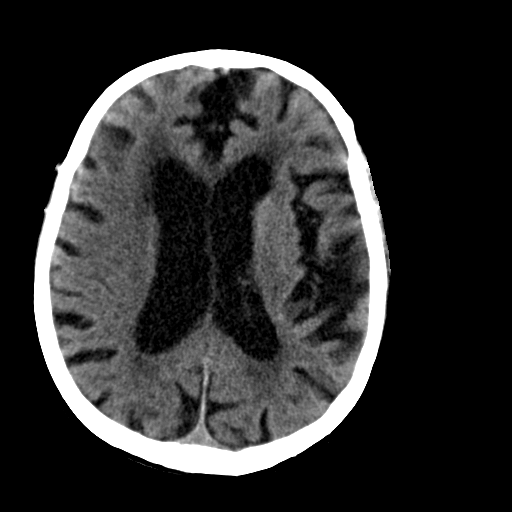
[im 20/31  brain]
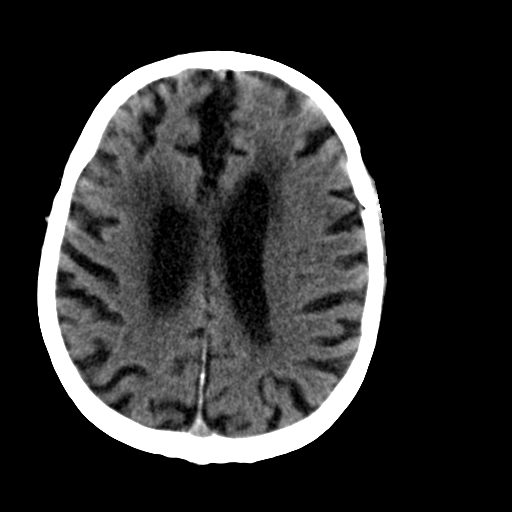
[im 20/31  bone]
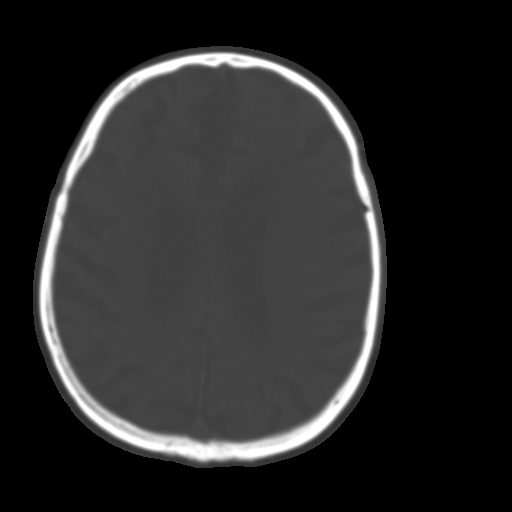
[im 22/31  brain]
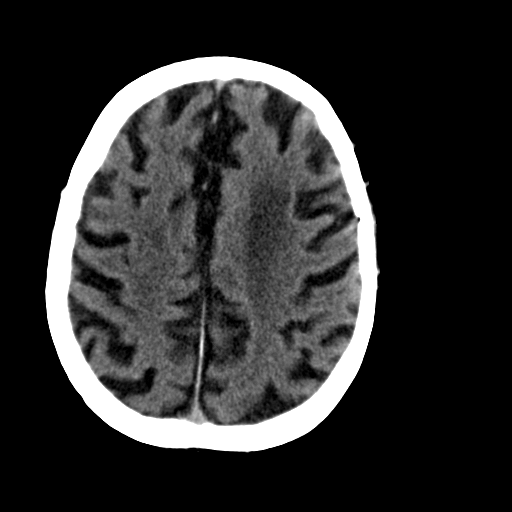
[im 24/31  brain]
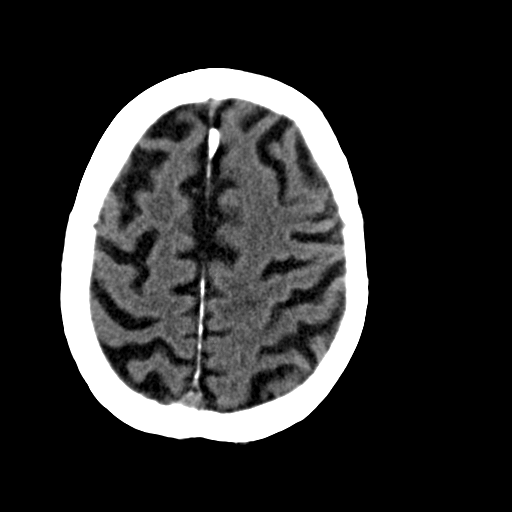
[im 26/31  brain]
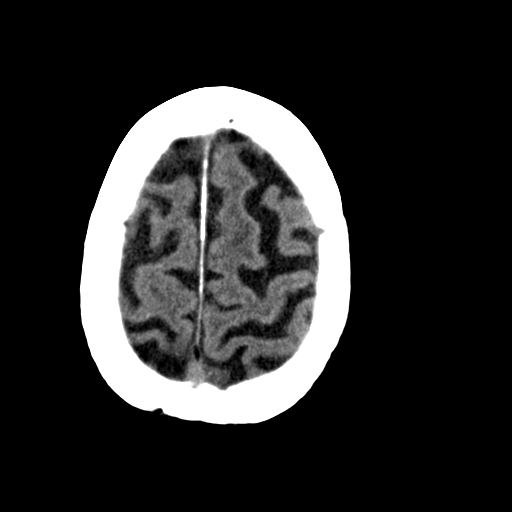
[im 28/31  brain]
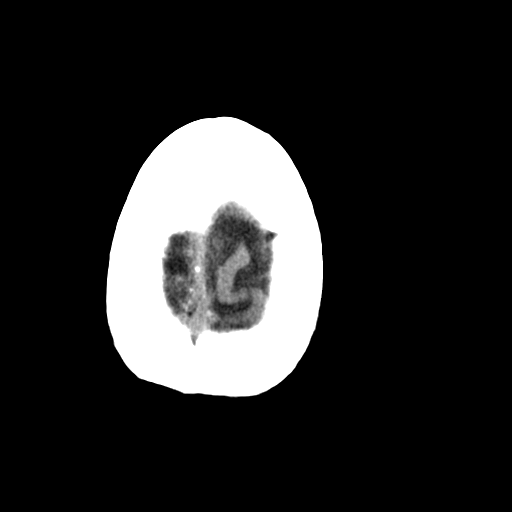
[im 28/31  bone]
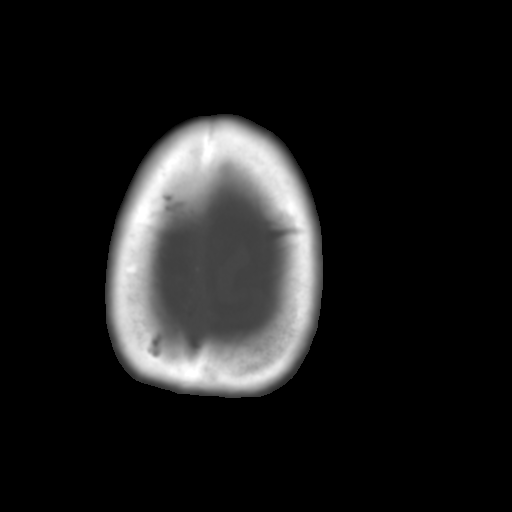

[Series 4: soft tissue · axial · 0.42mm/px · z∈[-163,-145]mm · 2 of 31 slices shown (2 of 2)]
[im 3/31  brain]
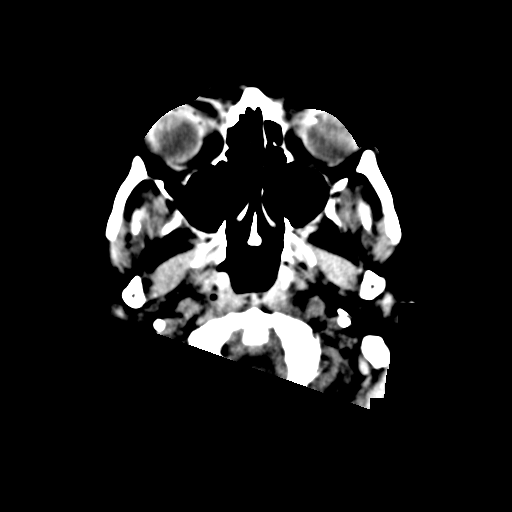
[im 7/31  brain]
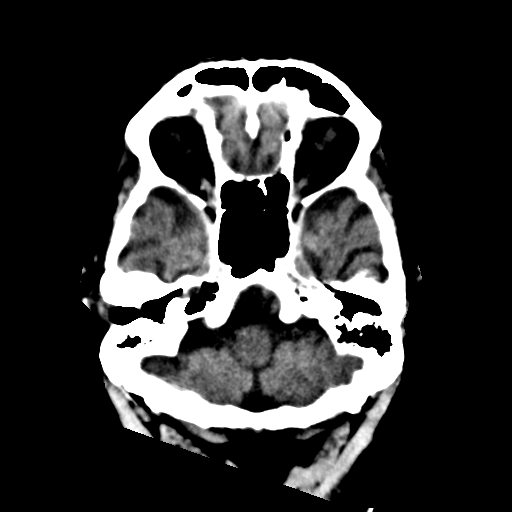

[15 of 30 positions shown; findings below may reference images not displayed]

FINDINGS: Multiple circumscribed lucent areas in both occipital bones are
unchanged. Elsewhere bone mineralization is normal. Visualized
paranasal sinuses and mastoids are clear.

Stable and negative orbit and scalp soft tissues.

Mild Calcified atherosclerosis at the skull base. Mild generalized
volume loss since 1171. No ventriculomegaly. Dural calcifications.
No midline shift, mass effect, or evidence of intracranial mass
lesion. Stable appearance of Major intracranial vasculature. No
suspicious intracranial vascular hyperdensity. Periventricular white
matter hypodensity not significantly changed. No evidence of
cortically based acute infarction identified. No acute intracranial
hemorrhage identified.
IMPRESSION: No acute intracranial abnormality. Abnormality, and largely negative
for age non contrast CT appearance of the brain.
